# Patient Record
Sex: Male | Born: 1975 | Race: White | Hispanic: No | Marital: Single | Smoking: Never smoker
Health system: Southern US, Community
[De-identification: ages and names within clinical notes are randomized; demographics above are authoritative.]

---

## 2020-02-05 ENCOUNTER — Ambulatory Visit (HOSPITAL_COMMUNITY)
Admission: EM | Admit: 2020-02-05 | Discharge: 2020-02-05 | Disposition: A | Discharge status: Home / Self Care | Payer: Federal, State, Local not specified - PPO | Attending: Emergency Medicine | Admitting: Emergency Medicine

## 2020-02-05 ENCOUNTER — Ambulatory Visit (INDEPENDENT_AMBULATORY_CARE_PROVIDER_SITE_OTHER): Payer: Federal, State, Local not specified - PPO

## 2020-02-05 ENCOUNTER — Other Ambulatory Visit: Payer: Self-pay

## 2020-02-05 ENCOUNTER — Encounter (HOSPITAL_COMMUNITY): Payer: Self-pay | Admitting: Emergency Medicine

## 2020-02-05 DIAGNOSIS — M25511 Pain in right shoulder: Secondary | ICD-10-CM

## 2020-02-05 DIAGNOSIS — S43101A Unspecified dislocation of right acromioclavicular joint, initial encounter: Secondary | ICD-10-CM | POA: Diagnosis not present

## 2020-02-05 DIAGNOSIS — W19XXXA Unspecified fall, initial encounter: Secondary | ICD-10-CM | POA: Diagnosis not present

## 2020-02-05 MED ORDER — HYDROCODONE-ACETAMINOPHEN 5-325 MG PO TABS
2.0000 | ORAL_TABLET | ORAL | 0 refills | Status: AC | PRN
Start: 2020-02-05 — End: ?

## 2020-02-05 NOTE — ED Provider Notes (Signed)
[facility]    CSN: [id number] Arrival date & time: 02/05/20  1138      History   Chief Complaint Chief Complaint  Patient presents with  . Shoulder Pain    HPI Todd Porter is a 45 y.o. male.   HPI   45 year old male here for evaluation of right shoulder pain.  Patient reports that his pain started last night after he slipped and fell on the ice.  Patient describes the fall as his feet going up underneath him and falling and landing on the back right side of his shoulder.  Patient states that he did not try and brace himself as his hands were in his pocket.  Patient states that he has full range of motion, but it does hurt to move his arm over his head.  Patient also denies numbness, tingling, or weakness to the right arm.  History reviewed. No pertinent past medical history.  There are no problems to display for this patient.   History reviewed. No pertinent surgical history.     Home Medications    Prior to Admission medications   Medication Sig Start Date End Date Taking? Authorizing Provider  HYDROcodone-acetaminophen (NORCO/VICODIN) 5-325 MG tablet Take 2 tablets by mouth every 4 (four) hours as needed. 02/05/20  Yes Becky Augusta, NP    Family History History reviewed. No pertinent family history.  Social History Social History   Tobacco Use  . Smoking status: Never Smoker  . Smokeless tobacco: Never Used     Allergies   Patient has no known allergies.   Review of Systems Review of Systems  Constitutional: Negative for activity change, appetite change and fever.  Musculoskeletal: Positive for arthralgias. Negative for joint swelling and neck pain.  Neurological: Negative for weakness and numbness.  Hematological: Negative.   Psychiatric/Behavioral: Negative.      Physical Exam Triage Vital Signs ED Triage Vitals  Enc Vitals Group     BP 02/05/20 1154 129/75     Pulse Rate 02/05/20 1154 (!) 51     Resp 02/05/20 1154 18     Temp  02/05/20 1154 98.7 F (37.1 C)     Temp Source 02/05/20 1154 Oral     SpO2 02/05/20 1154 98 %     Weight --      Height --      Head Circumference --      Peak Flow --      Pain Score 02/05/20 1157 6     Pain Loc --      Pain Edu? --      Excl. in GC? --    No data found.  Updated Vital Signs BP 129/75 (BP Location: Left Arm)   Pulse (!) 51   Temp 98.7 F (37.1 C) (Oral)   Resp 18   SpO2 98%   Visual Acuity Right Eye Distance:   Left Eye Distance:   Bilateral Distance:    Right Eye Near:   Left Eye Near:    Bilateral Near:     Physical Exam Vitals and nursing note reviewed.  Constitutional:      General: He is not in acute distress.    Appearance: Normal appearance. He is normal weight. He is not toxic-appearing.  HENT:     Head: Normocephalic and atraumatic.  Musculoskeletal:        General: Tenderness present. Normal range of motion.  Skin:    General: Skin is warm and dry.     Capillary Refill:  Capillary refill takes less than 2 seconds.  Neurological:     General: No focal deficit present.     Mental Status: He is alert and oriented to person, place, and time.     Motor: No weakness.  Psychiatric:        Mood and Affect: Mood normal.        Behavior: Behavior normal.        Thought Content: Thought content normal.        Judgment: Judgment normal.      UC Treatments / Results  Labs (all labs ordered are listed, but only abnormal results are displayed) Labs Reviewed - No data to display  EKG   Radiology DG Shoulder Right  Result Date: 02/05/2020 CLINICAL DATA:  Status post fall with right shoulder pain. EXAM: RIGHT SHOULDER - 2+ VIEW COMPARISON:  None. FINDINGS: There is no evidence of fracture or dislocation. There is no evidence of arthropathy or other focal bone abnormality. Soft tissues are unremarkable. IMPRESSION: Negative. Electronically Signed   By: Sherian Rein M.D.   On: 02/05/2020 12:22    Procedures Procedures (including critical  care time)  Medications Ordered in UC Medications - No data to display  Initial Impression / Assessment and Plan / UC Course  I have reviewed the triage vital signs and the nursing notes.  Pertinent labs & imaging results that were available during my care of the patient were reviewed by me and considered in my medical decision making (see chart for details).   Patient is a very pleasant 45 year old male here for evaluation of right shoulder pain after falling on the ice last night.  Patient denies numbness, tingling, or weakness to his right hand, wrist, forearm, or upper arm.  Patient has full sensation in his right hand, cap refill less than 2 seconds, grip strength is 5/5.  No pain with passive or active range of motion of right wrist or forearm.  No tenderness to palpation in the biceps muscle triceps muscle, or along the body of the humerus.  Patient has no tenderness with palpation of the clavicle or his pectoralis major.  Patient does have tenderness over the right acromion process and the acromion feels like it is elevated more than the left side.  Patient also has tenderness to the vertebral order of the right scapula with a little bit of scapular winging present.  There is no crepitus on palpation.  Will obtain radiograph of right shoulder.     Final Clinical Impressions(s) / UC Diagnoses   Final diagnoses:  AC separation, right, initial encounter     Discharge Instructions     Take over-the-counter Tylenol and ibuprofen according to the package instructions as needed for pain and inflammation.  Use the Norco, 1 tablet every 6 hours, as needed for severe pain.  Follow the rehabilitation exercises given your discharge instructions.  If your symptoms continue follow-up with orthopedics when you get home.    ED Prescriptions    Medication Sig Dispense Auth. Provider   HYDROcodone-acetaminophen (NORCO/VICODIN) 5-325 MG tablet Take 2 tablets by mouth every 4 (four) hours as  needed. 6 tablet Becky Augusta, NP     I have reviewed the PDMP during this encounter.   Becky Augusta, NP 02/05/20 1240

## 2020-02-05 NOTE — ED Triage Notes (Signed)
Pt reports falling on ice and now has rt shoulder pain. Pt can raise arm above head.

## 2020-02-05 NOTE — Discharge Instructions (Signed)
Take over-the-counter Tylenol and ibuprofen according to the package instructions as needed for pain and inflammation.  Use the Norco, 1 tablet every 6 hours, as needed for severe pain.  Follow the rehabilitation exercises given your discharge instructions.  If your symptoms continue follow-up with orthopedics when you get home.

## 2021-11-26 IMAGING — DX DG SHOULDER 2+V*R*
4 series · 4 of 4 positions shown · non-contrast
Comparison: None.

CLINICAL DATA: Status post fall with right shoulder pain.

EXAM:
RIGHT SHOULDER - 2+ VIEW

[shoulder ap]
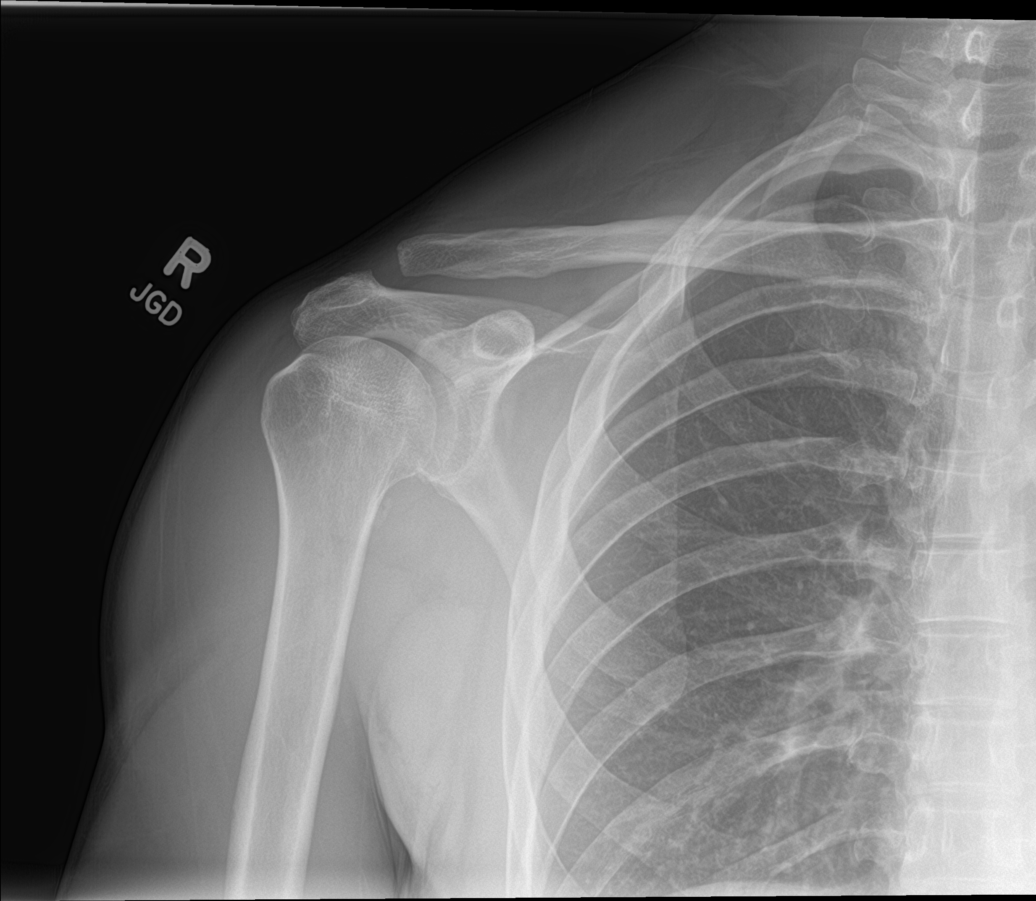

[shoulder grashey]
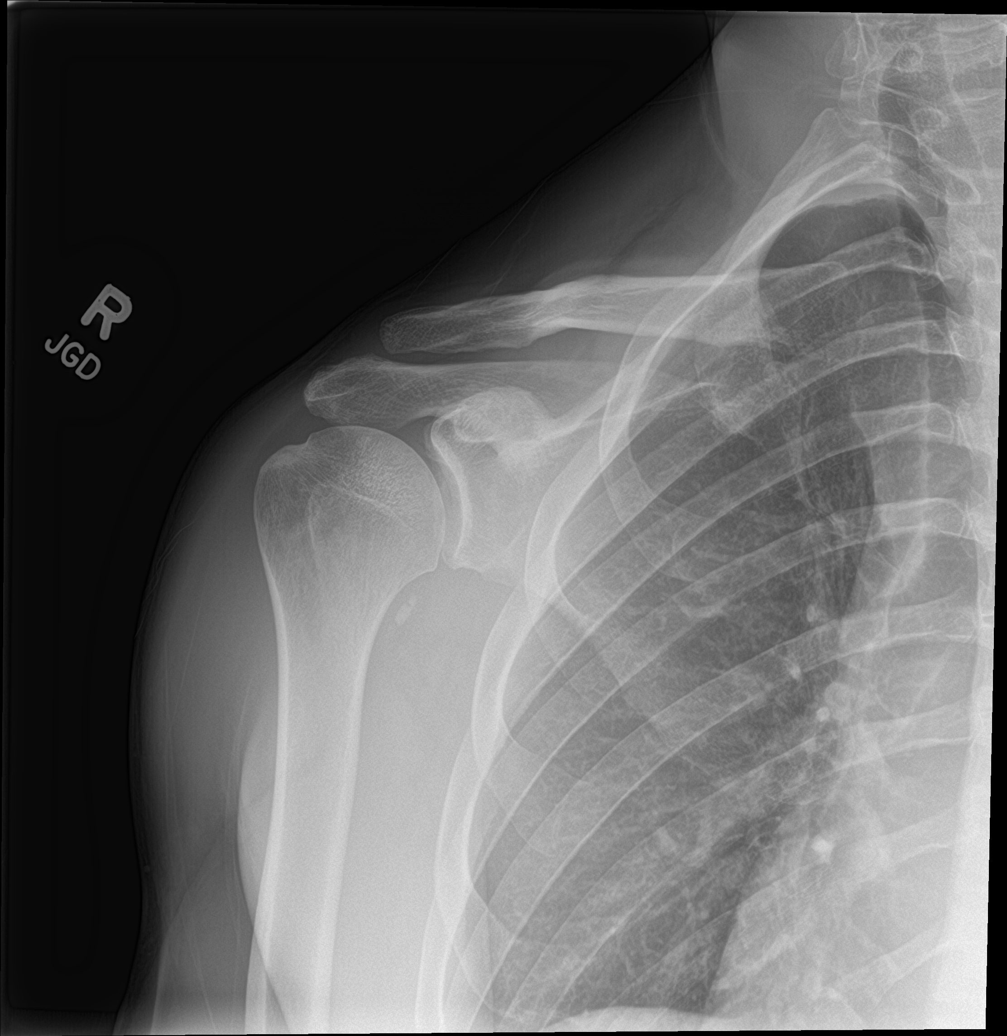

[shoulder y-view]
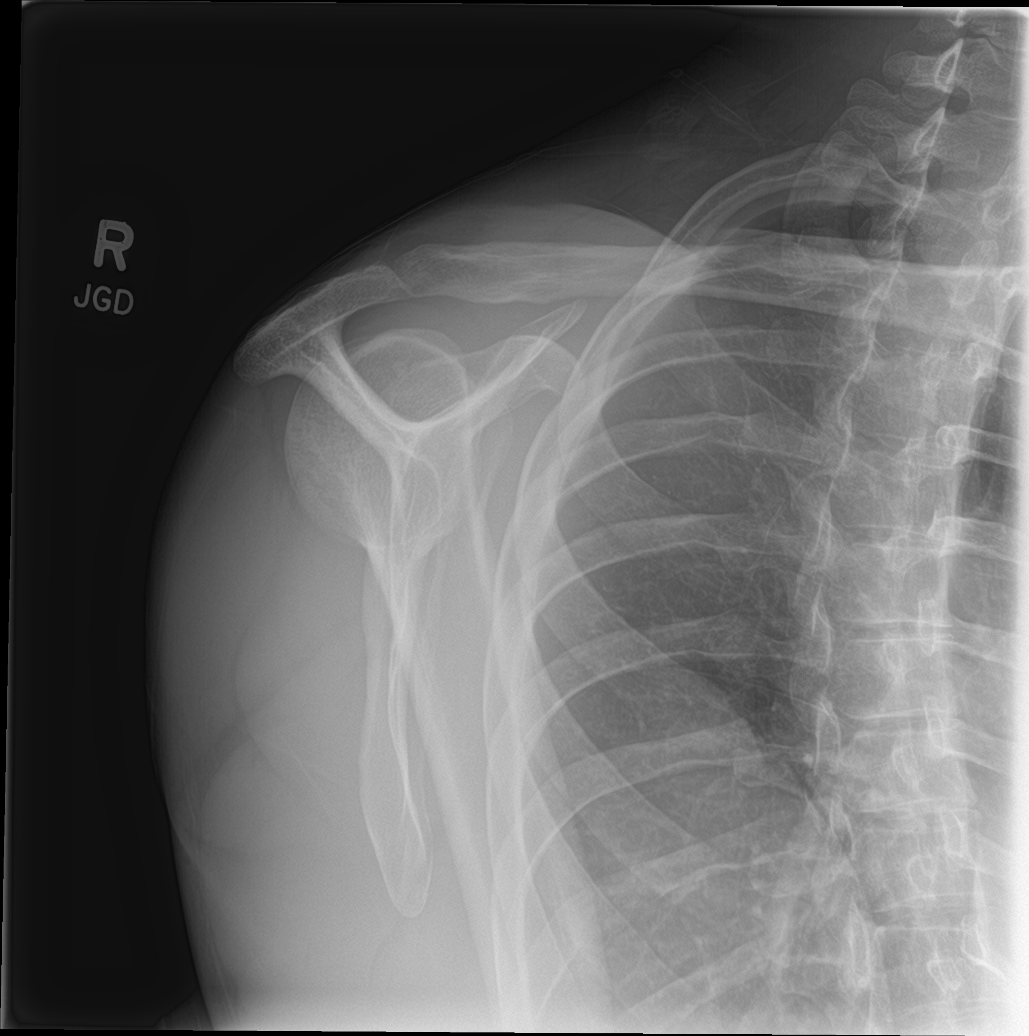

[shoulder axial]
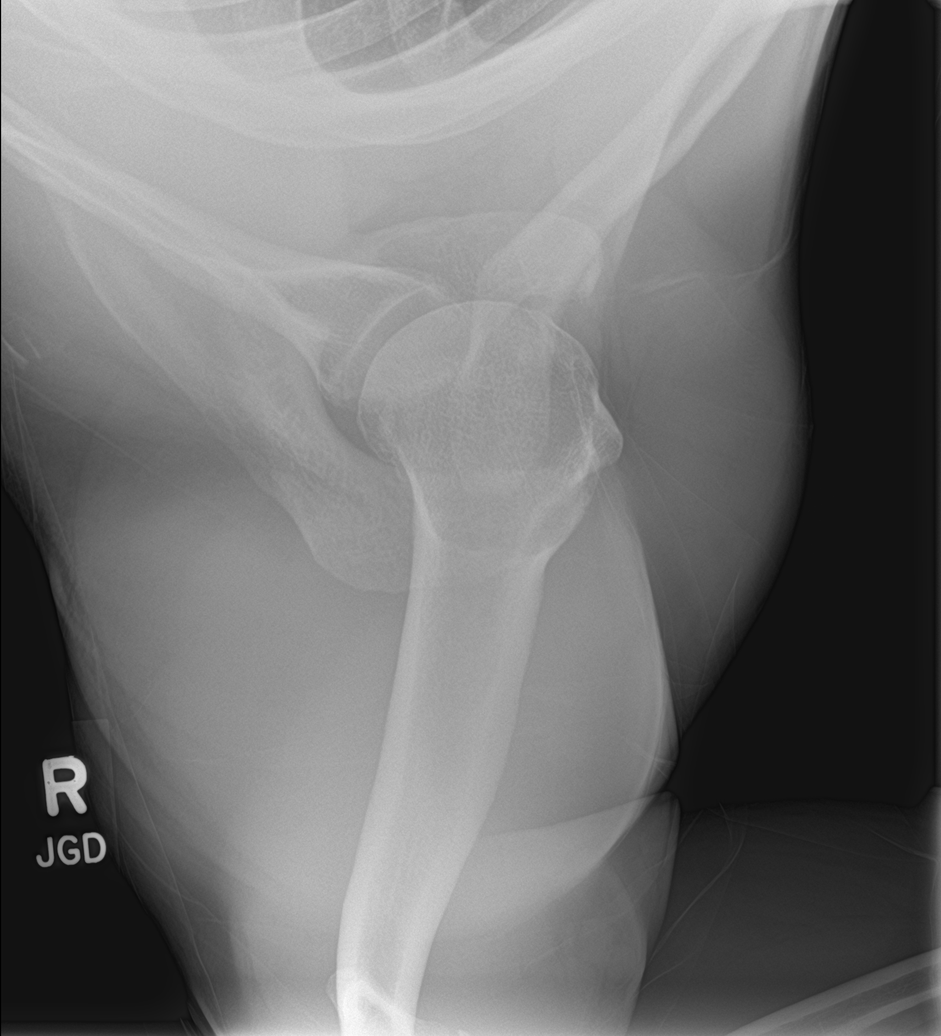

[4 of 4 positions shown; findings below may reference images not displayed]

FINDINGS: There is no evidence of fracture or dislocation. There is no
evidence of arthropathy or other focal bone abnormality. Soft
tissues are unremarkable.
IMPRESSION: Negative.
